# Patient Record
Sex: Male | Born: 1955 | Race: Black or African American | Hispanic: No | Marital: Married | Smoking: Heavy tobacco smoker
Health system: Southern US, Community
[De-identification: ages and names within clinical notes are randomized; demographics above are authoritative.]

---

## 2006-11-15 ENCOUNTER — Ambulatory Visit: Payer: Self-pay | Admitting: Internal Medicine

## 2006-11-15 ENCOUNTER — Inpatient Hospital Stay (HOSPITAL_COMMUNITY): Admission: EM | Admit: 2006-11-15 | Discharge: 2006-11-16 | Payer: Self-pay | Admitting: Internal Medicine

## 2006-12-01 ENCOUNTER — Ambulatory Visit: Payer: Self-pay | Admitting: Cardiology

## 2006-12-26 ENCOUNTER — Ambulatory Visit: Payer: Self-pay | Admitting: Cardiology

## 2006-12-29 ENCOUNTER — Ambulatory Visit: Payer: Self-pay | Admitting: Cardiology

## 2007-03-30 ENCOUNTER — Ambulatory Visit: Payer: Self-pay | Admitting: Cardiology

## 2007-08-03 ENCOUNTER — Ambulatory Visit: Payer: Self-pay | Admitting: Cardiology

## 2008-01-22 ENCOUNTER — Ambulatory Visit: Payer: Self-pay | Admitting: Cardiology

## 2011-05-04 NOTE — Assessment & Plan Note (Signed)
[facility]                          [facility] NOTE   Evan Beasley, Evan Beasley                       MRN:          [id number]  DATE:01/22/2008                            DOB:          08/31/1956    Evan Beasley returns today for follow-up.  He had an MI in November of  2007.  He was not a candidate at that time for PCI.  He had inferior  hypokinesis and his ejection fraction was 50%.  He had been in the Prove-  It trial for a while, but it was not possible for him to take his  medicines regularly and therefore he came out of the trial.  Fortunately, he does have a primary physician now in [place].  He  is not having any significant chest pain.  He says he has some balance  problems and said a  shivering sensation in his right leg.  There has  been no syncope or pre-syncope.   PAST MEDICAL HISTORY:   ALLERGIES:  NO KNOWN DRUG ALLERGIES.   MEDICATIONS:  Aspirin, metoprolol, simvastatin, recent dosing of  dexamethasone and gabapentin.   OTHER MEDICAL PROBLEMS:  See the list below.   REVIEW OF SYSTEMS:  Other than the HPI, his review of systems is  negative.   PHYSICAL EXAM:  Blood pressure today is 125/74 with a pulse of 96.  The  patient is oriented to person, time and place.  Affect is normal.  Weight is 131 pounds.  Lungs are clear.  Respiratory effort is not labored.  Cardiac exam reveals S1-S2.  There are no clicks or significant murmurs.  There are no carotid bruits.  There is no jugular venous distention.  His abdomen is soft.  He has normal bowel sounds.  There is no peripheral edema.   No labs were done today.   PROBLEMS INCLUDE:  1. Coronary disease status post myocardial infarction in November of      2007.  2. Ejection fraction 50%.  3. History of anemia in the past.  4. Hyperlipidemia.  5. History of tobacco.  6. Some problems that are ongoing with his right foot.   Dr. Thedore Mins is working him up.  I believe his  overall cardiac status is  stable.  No further testing.     Luis Abed, MD, [facility]  Electronically Signed    JDK/MedQ  DD: 01/22/2008  DT: 01/22/2008  Job #: [id number]   cc:   Wyonia Hough, MD

## 2011-05-04 NOTE — Assessment & Plan Note (Signed)
[facility]                          [facility]   LINDELL, RENFREW                       MRN:          [id number]  DATE:08/03/2007                            DOB:          [birth year]    Mr. Evan Beasley has coronary disease and he is stable.  He had an MI in  November 2007.  It was felt that he was not a candidate for PCI.  He had  inferior hypokinesis and an ejection fraction of 50%.  He was involved  in the Prove-It trial, but he was not able to take his medications  regularly and this was stopped.  He is not having any significant chest  pain.  He is going about reasonable activities.  He does have problems  with his right foot and right knee at times.  He has good pulse and this  is not cardiovascular in origin.  I have encouraged him to find a  primary MD.   PAST MEDICAL HISTORY:   ALLERGIES:  No known drug allergies.   MEDICATIONS:  Aspirin, metoprolol and simvastatin.   OTHER MEDICAL PROBLEMS:  See the list below.   REVIEW OF SYSTEMS:  Other than his foot, his review of systems is  negative.   PHYSICAL EXAMINATION:  Blood pressure is 123/83 with a pulse of 79.  The patient is oriented to person, time and place.  Affect is normal.  HEENT:  No xanthelasma.  He has normal extraocular motion.  There are no carotid bruits.  There is no jugulovenous distention.  CARDIAC:  Exam reveals an S1 with an S2.  There are no clicks or  significant murmurs.  ABDOMEN:  Soft.  He has normal bowel sounds.  He has 2+ distal pulses.   PROBLEMS:  1. Coronary disease, post myocardial infarction.  2. Ejection fraction of 50%.  3. History of an anemia that was mild.  4. Hyperlipidemia.  5. History of tobacco.  6. Problems with his right foot.   His cardiac status is stable.  No further cardiac workup.  I can see him  back in 6 months in cardiology followup.  He needs a primary MD.     Luis Abed, MD, [facility]  Electronically Signed   JDK/MedQ  DD: 08/03/2007  DT: 08/04/2007  Job #: [id number]

## 2011-05-07 NOTE — Discharge Summary (Signed)
NAME:  Evan Beasley, STRUBEL              ACCOUNT NO.:  [id number]   MEDICAL RECORD NO.:  [id number]          PATIENT TYPE:  INP   LOCATION:  6533                         FACILITY:  [facility]   PHYSICIAN:  Arturo Morton. Riley Kill, MD, FACCDATE OF BIRTH:  02-02-1956   DATE OF ADMISSION:  11/15/2006  DATE OF DISCHARGE:  11/16/2006                               DISCHARGE SUMMARY   PROCEDURES:  1. Cardiac catheterization.  2. Coronary arteriogram.  3. Left ventriculogram.  4. Attempted percutaneous intervention of one vessel.   PRIMARY DIAGNOSIS:  Inferior non-ST segment elevation myocardial  infarction.   SECONDARY DIAGNOSIS:  1. Dyslipidemia with a total cholesterol 143, triglycerides 42, HDL      42, LDL 93.  2. Anemia with a hemoglobin of 11.7 and hematocrit of 35.1 at      [facility] and hemoglobin 10.7 and hematocrit 32.2 (post procedure)      at discharge, fecal occult blood negative x1.  3. 30-pack-year history of tobacco use, quit on admission.   TIME OF DISCHARGE:  36 minutes.   HOSPITAL COURSE:  Mr. [name] is a 55 year old male with no previous  history of coronary artery disease.  He had chest pain that started five  days prior to admission.  His CK MBs were negative but his troponins  were elevated.  He was admitted for further evaluation and treatment.   Mr. [name] had a cardiac catheterization on November 15, 2006, which  showed a normal LAD, left main and circumflex, but the RCA was totaled  proximally with probable thrombus and left-to-right collaterals.  His EF  was 50% with mild inferobasal hypokinesis and 2+ MR.  Dr. Excell Seltzer  attempted percutaneous intervention to the RCA but was unable to advance  the guide-wire beyond the occlusion, therefore, medical therapy was felt  the best option.   Mr. [name] was seen by research and placed in the Improvement Study for  his lipid profile.  A chest x-ray showed some possible nodularity but a  2D chest x-ray without telemetry leads  showed COPD but no acute disease.   Mr. [name] was noted to be anemic with a hemoglobin of 11.7 and  hematocrit of 35.1 at [facility].  Post procedure, it dropped slightly to  10.7 and 32.2.  Fecal occult blood was negative x1.  Mr. [name] is  advised to obtain a family physician and follow up as quickly as  possible.  Because of this, he was not placed on Plavix but is to take  aspirin daily.   On November 16, 2006, Mr. [name] was seen by cardiac rehab.  He was  ambulating without chest pain or shortness of breath and educated  regarding MI restrictions, exercise guidelines, and lifestyle  modifications.  He was evaluated by Dr. Riley Kill and considered stable  for discharge with outpatient follow-up arranged.   DISCHARGE INSTRUCTIONS:  His activity level is to be increased gradually  per cardiac rehab guidelines.  He is not to drive for two days and not  to lift anything heavy for three weeks.  He is to call our office for  any problems with the  cath site.  He is to stick to a low fat diet.  He  is not to use tobacco.  He is to obtain a family physician and follow up  his anemia.  He has an appointment with the [facility] office on  December 13 at 1:30 p.m.   DISCHARGE MEDICATIONS:  1. Aspirin 325 mg daily.  2. Improvement medicine daily.  3. Nitroglycerin sublingual p.r.n..   Consideration was given to placing Mr. [name] on a beta blocker or ACE  inhibitor, but his systolic blood pressure was 90-100 on no medications  and so this is deferred for now.      Theodore Demark, PA-C      Arturo Morton. Riley Kill, MD, [facility]  Electronically Signed    RB/MEDQ  D:  11/16/2006  T:  11/16/2006  Job:  [id number]

## 2011-05-07 NOTE — Assessment & Plan Note (Signed)
[facility] HEALTHCARE                            CARDIOLOGY OFFICE NOTE   AURYN, PAIGE                       MRN:          [id number]  DATE:03/30/2007                            DOB:          06-28-1956    Evan Beasley is seen for follow-up of his coronary disease.  He had an MI.  This area was not amenable to PCI.  He has an ejection fraction of 50%  with inferiorly hypokinesis.  He had been involved in the IMPROVE-IT  trial, however, he was not able to take his medications well.  He did  have some back discomfort.  He stopped his medications.  The follow-up  was difficult and we decided to stop the study drug.  He is here for  follow-up now.   He is not having any chest pain.  He is going about full activities.   ALLERGIES:  NO KNOWN DRUG ALLERGIES.   MEDICATIONS:  Aspirin and metoprolol and we will add Simvastatin.   OTHER MEDICAL PROBLEMS:  See the list below.   REVIEW OF SYSTEMS:  He is feeling well.  He has some slight difficulty  raising the toe on his foot.  The exact etiology is not clear.  There is  no pain.  He has no problems ambulating.  Otherwise, review of systems  is negative.   PHYSICAL EXAMINATION:  GENERAL APPEARANCE:  Patient is oriented to  person, time and place.  Affect is normal.  VITAL SIGNS:  Blood pressure 118/76 with a pulse of 78, weight 127  pounds.  LUNGS:  Clear.  Respiratory effort is not labored.  CARDIOVASCULAR:  Exam reveals an S1 with an S2.  There are no clicks or  significant murmurs.  ABDOMEN:  Soft.  He has no significant peripheral edema.   Evan Beasley is stable.  We will take him off of the IMPROVE-IT trial.  We  will start Simvastatin 40 mg daily and see him for follow-up.   PROBLEMS:  1. Coronary disease, post myocardial infarction.  2. Anemia that was mild.  3. Hyperlipidemia.  4. History of tobacco.   We will see him for follow-up in the [place], [place], office.     Luis Abed, MD,  [facility]  Electronically Signed    JDK/MedQ  DD: 03/30/2007  DT: 03/30/2007  Job #: [id number]   cc:   [facility] office

## 2011-05-07 NOTE — H&P (Signed)
NAME:  Evan Beasley, Evan Beasley NO.:  [id number]   MEDICAL RECORD NO.:  [id number]          PATIENT TYPE:  INP   LOCATION:  3707                         FACILITY:  [facility]   PHYSICIAN:  Pricilla Riffle, MD, FACCDATE OF BIRTH:  19-Feb-1956   DATE OF ADMISSION:  11/15/2006  DATE OF DISCHARGE:                              HISTORY & PHYSICAL   He has no primary care physician.   The patient will be FULL CODE.   This patient was the historian; he was a good historian.   Total visit time 49 minutes.   CHIEF COMPLAINT:  Transfer from [facility] ER for chest pain and abnormal  laboratory values.   HISTORY OF PRESENT ILLNESS:  Evan Beasley is a 55 year old male with no  history of coronary artery disease, history of longstanding tobacco  abuse who presented to the emergency room at [facility] with chest pain.  The patient notes chest pain four days prior to being seen in the ER at  [facility], five days prior to admission here, in the morning of that day  while smoking a cigarette at rest, described as left-sided precordial  pressure sensation with no radiation lasting 30 minutes, severe,  associated with sweats and flushing, but pain spontaneously relieved.  The patient notes he has had no problems recently with shortness of  breath, paroxysmal nocturnal dyspnea or any dyspnea on exertion.  He  denies having problems with syncope, palpitations.  He denies any  claudication symptoms or lower extremity edema.  The patient notes he  had no problems with chest pain or chest pressure until the day prior to  admission.  While at work, he had some mild chest pressure and told one  of his coworkers who suggested that he be seen in the ER.  The patient's  wife then drove him to the emergency room.  In the emergency room,  patient was found to have a normal EKG, given aspirin 81 versus 324 mg,  given metoprolol 5 IV, normal saline, NitroPaste and nitro sublingual  and also enoxaparin at 2313,  60 mg.   PAST MEDICAL HISTORY AND PAST SURGICAL HISTORY:  He has no past surgical  history.  History of tobacco use.   ALLERGIES:  No known drug allergies.   MEDICATIONS:  None.   SOCIAL HISTORY:  Smoker, one pack per day for the last 30 years; he  notes he will quit and has not smoked since the episode of chest pain.  No illicit drug use, rare alcohol.  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  He is married with one daughter.   REVIEW OF SYSTEMS:  CONSTITUTIONAL:  The patient denies having any  fever.  He notes flushing and sweats as above.  EYES:  Concerning the  eyes, he denies any drainage from the eye or eye pain.  EARS/NOSE/MOUTH/THROAT:  No throat swelling or throat pain.  CARDIOVASCULAR:  As above.  RESPIRATORY:  He denies any shortness of  breath or wheezing or cough.  GASTROINTESTINAL:  He denies any nausea or  diarrhea.  GENITOURINARY:  He denies any dysuria or  hematuria.  MUSCULOSKELETAL:  He denies any myalgias or arthralgias.  INTEGUMENTARY:  He denies any skin changes or rash.  NEUROLOGICAL:  He denies any focal  weakness or seizure history.  PSYCHIATRIC:  He denies any depression or  suicidal ideations.  ENDOCRINE:  No polyuria or polydipsia.  HEMATOLOGIC/LYMPHATIC:  He denies any knots or any easy bruising.  ALLERGIC/IMMUNOLOGIC:  He denies any allergies to aspirin, no allergic  rhinitis history.   PHYSICAL EXAMINATION:  VITAL SIGNS:  Temperature 98, blood pressure  106/68 with a pulse of 14, respiratory rate of 69, saturation 100% on  two liters.  He is 65.7 kg.  GENERAL:  He is a very thin male, otherwise healthy, no acute distress  lying flat in bed.  EYES:  Inspection of the conjunctiva and lids reveals his conjunctivae  are not pale and no masses over the lids.  Anicteric sclerae.  Pupils  equal, round and reactive to light.  Extraocular movements are intact.  EAR/NOSE/MOUTH/THROAT EXAM:  External  inspection of the ears and nose  reveals no lesions  or masses.  Testing to hearing by finger rub is full.  Inspection of the nasal mucosa, septum and turbinates show no bogginess  to the mucosa, no lesions.  Inspection of the lips, teeth and gums,  salivary glands, hard and soft palate, tongue, tonsils and posterior  pharynx revealed good dentition with no oropharyngeal lesions or masses.  NECK EXAMINATION:  His trachea is midline with no crepitus, no  thyromegaly.  RESPIRATORY EXAM:  No use of accessory muscles, no intercostal  retractions.  Lungs are clear to auscultation bilaterally; no dullness  to percussion.  CARDIOVASCULAR EXAM:  Regular rhythm and rate with no murmurs, rubs or  gallops, normal S1, S2, no S3 or S4, no elevated jugular venous  distention.  No abdominal bruits.  Pulses are 2+ bilateral radial and  also dorsalis pedis and posterior tibial.  GASTROINTESTINAL EXAM:  The abdomen is scaphoid, nontender,  nondistended, no hepatosplenomegaly, normoactive bowel sounds.  LYMPHATIC EXAM:  The neck and axillae reveal no lymphadenopathy.  MUSCULOSKELETAL EXAM:  Including palpation and inspection of the digits  and nails reveals no clubbing, cyanosis or evidence of petechiae.  Examination by palpation of the joints, bones and muscles over the head,  neck, spine, ribs and pelvis, right and left upper extremity, right and  left lower extremity reveals no signs of misalignment, masses or  effusions.  SKIN EXAMINATION:  He has no rashes, lesions or ulcers.  NEUROLOGIC EXAMINATION:  His cranial nerves II-XII are grossly intact.  Strength and sensation are grossly intact.  PSYCHIATRIC EXAM:  Reveals normal affect, alert and oriented x4.   DIAGNOSTIC DATA:  EKG done in the ER outside hospital at 9:44 November  26 showed sinus rhythm at 65 with normal axis, normal intervals with T  wave inversions and ST downsloping in II, III and aVF.   EKG done November 15, 2006 at 3:44 a.m. here was normal sinus rhythm at 62 with normal axis,  normal intervals with no acute ST-T changes.  He  did have some mild T-wave inversions in II and aVF.   Outside hospital labs showed a sodium of 37, potassium of 3.5, chloride  of 102, bicarb 30, BUN 13, creatinine 1.4, glucose 108; unsure of his  baseline.  Calcium is 9.2, CK 279, MB 3.2, troponin 4.83, INR 1.0, PT  11.6, PTT 27.2, D-dimer was normal at 342.  BNP was mildly elevated at  113.  White count  was 11.8, hemoglobin 11.7, hematocrit 35.1, platelets  190, segs 92.5.   ASSESSMENT/PLAN:  This is a patient with a history of longstanding  tobacco abuse now with possible, most likely a non-ST elevation MI,  after noting the EKG changes and elevated troponin.  Since given his  therapy in the emergency room while here, his EKG changes have near-  resolved and he has had no chest pain since, although his primary  episode may have occurred four to five days ago.  He did have a 10-beat  run of V-tach, what looked like a wide-complex rhythm, most likely V-  tach on telemetry which is likely due to ischemia.   1. For his non-ST elevation MI, the patient will be held n.p.o. for      possible procedure.  Ejection fraction can be obtained by left      ventriculogram or by echocardiogram; will hold on the order for      now.  He will be given aspirin 325, Lovenox q.12., Integrilin drip.      Statin will be started and we will check his lipids; Plavix 375.      He will be started on a p.o. beta-blocker and we will check his      TSH.  He will remain on telemetry and we will check his cardiac      markers x3.  2. For his V-tach, again he will be on a beta-blocker and we will try      to relieve his ischemia by antiplatelet and anticoagulation      therapy.  3. For his GI prophylaxis, proton pump inhibitor and for DVT      prophylaxis, he is on Lovenox.      Darryl D. Prime, MD   Electronically Signed     ______________________________  Pricilla Riffle, MD, [facility]    DDP/MEDQ  D:   11/15/2006  T:  11/15/2006  Job:  [id number]

## 2011-05-07 NOTE — Cardiovascular Report (Signed)
NAME:  Evan Beasley, Evan Beasley              ACCOUNT NO.:  [id number]   MEDICAL RECORD NO.:  [id number]          PATIENT TYPE:  INP   LOCATION:  6533                         FACILITY:  [facility]   PHYSICIAN:  Veverly Fells. Excell Seltzer, MD  DATE OF BIRTH:  1956-06-20   DATE OF PROCEDURE:  11/15/2006  DATE OF DISCHARGE:                            CARDIAC CATHETERIZATION   PROCEDURE:  Left heart catheterization, selective coronary angiography,  left ventricular angiography, attempted percutaneous coronary  intervention of the right coronary artery and Angio-Seal of the right  femoral artery.   INDICATIONS:  Mr. Evan Beasley is a very nice 55 year old gentleman who  presented with chest pain that occurred five days prior to his  presentation.  He had recurrent pain that brought him to the hospital  and he ultimately ruled in for a myocardial infarction with a troponin  that peaked at approximately 5.0.  He was referred for diagnostic  cardiac catheterization in the setting of a subendocardial MI.   PROCEDURAL DETAILS:  Risks and indications of the procedure were  explained in detail to the patient.  Informed consent was obtained.  The  right groin was prepped, draped and anesthetized with 1% lidocaine under  normal sterile conditions.  Using a modified Seldinger technique, a 6-  [place] arterial sheath was placed in the right femoral artery.  Initial  angiographic images of the left coronary artery were taken with a  diagnostic JL-4 catheter.  Following the left coronary artery  angiograms, an angled pigtail catheter was inserted in the left  ventricle and ventricular pressures were recorded.  A 30 degree right  anterior oblique left ventriculogram was performed.  A pullback across  the aortic valve was done.  After left ventriculography, a 6-French JR-4  coronary guiding catheter was inserted into the right coronary artery  and angiographic images demonstrated a total occlusion of that vessel.  At that point,  because of the appearance of a significant thrombus  burden, heparin and Integrilin were given for anticoagulation.  Once a  therapeutic ACT was achieved, I attempted to cross the lesion initially  with [place]ougar coronary guidewire.  I was unable to cross with this wire.  I then changed out for a 300 cm whisper wire and 2.0 x 12 mm sprinter  balloon.  Even with the whisper wire, I was unable to cross the lesion  due to marked tortuosity in the proximal vessel as well as a long  segment of occlusion in the mid-vessel.  Leaving the whisper wire in  place, I inserted a PT light weight wire and was also unable to cross  with this.  I took multiple angiograms from different views and really  was unable to ever visualize the course of the vessel beyond the  occlusion.  At this point, I thought the benefit of proceeding further  was minimal and the risk was significant.  I elected to discontinue the  intervention and treat the patient medically.  The guidewire and guiding  catheter were removed and a 6-French Angio-Seal was used to close the  femoral arteriotomy.   FINDINGS:  Aortic pressure 83/49 with  a mean of 65.  Left ventricular  pressure 86/3 with an end-diastolic pressure of 10.   CORONARY ANGIOGRAPHY:  Left mainstem is angiographically normal.  It  trifurcates into the LAD, ramus intermedius and left circumflex.   The LAD is a large-caliber vessel that courses down to the left  ventricular apex.  There are three medium caliber diagonal branches.  There are multiple septal perforators.  There is no significant  angiographic disease in the LAD distribution.   Ramus intermedius is a large-caliber vessel that bifurcates into twin  vessels.  It is angiographically normal.   Left circumflex is large caliber vessel that courses down the AV groove  and gives off two posterolateral branches.  There is only one small  obtuse marginal branch.  There is no significant disease throughout the  left  circumflex.  Left circumflex system provides a well-formed  collateral to the distal right coronary artery and the entire right PDA  fills from left-sided collaterals.  Right PDA is a large vessel.   The right coronary artery is very tortuous in its proximal portion.  It  is a large diameter vessel.  It is 100% occluded just after the first RV  marginal branch.  There is the appearance of thrombus at the level of  occlusion.  There is some faint distal filling via right to right  collaterals.   Left ventriculogram performed in a 30 degree right anterior oblique  projection demonstrated mild inferior basal hypokinesis with an overall  preserved left ventricular ejection fraction of 50%.  There is 2+ mitral  regurgitation present.   ASSESSMENT:  1. Single-vessel coronary artery disease with an occluded right      coronary artery.  2. Mild segmental left ventricular dysfunction with overall preserved      left ventricular ejection fraction.   PLAN:  As described above, attempted PCI of the right coronary artery  but unable to cross the lesion with a guidewire despite attempting  multiple wires as well as attempting support with an over the wire  balloon.  Since the patient has been painfree and has a well-formed  collateral to the distal right coronary artery, I think the risk of  proceeding more aggressively in the right coronary artery with stiff  guidewires outweighs the potential benefits.  In addition, the proximal  vessel was extremely tortuous and not amendable to a stiff wire.  Recommend medical therapy for continued treatment of his coronary artery  disease.      Veverly Fells. Excell Seltzer, MD  Electronically Signed     MDC/MEDQ  D:  11/15/2006  T:  11/16/2006  Job:  [id number]

## 2011-05-07 NOTE — Assessment & Plan Note (Signed)
[facility]                          [facility] NOTE   CASSIDY, TABET                       MRN:          [id number]  DATE:12/01/2006                            DOB:          Oct 03, 1956    Mr. Zwilling is new to [place]. He does not have a primary physician. He was  seen at the emergency room at [facility] and transported to [facility]. He  was having a non ST elevation MI. He underwent catheterization. This was  done on November 15, 2006 by Dr. Excell Seltzer. It is noted that his troponin  peaked in the range of 5. 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 His ejection  fraction was in the 50% range. PCI was attempted to the right coronary  artery but it was not possible to cross the lesion. The guidewire could  not be crossed. The patient was pain free by then and he had well formed  collateral to his distal right. It was felt that the risk of proceeding  outweighed the potential benefit. The proximal vessel was very tortuous  and this made the procedure even more difficult. Medical therapy is  recommended.   The patient returns now. He is having some fatigue. He had some vague  chest discomfort but nothing like the pain that brought him into the  hospital.   PAST MEDICAL HISTORY:   ALLERGIES:  No known drug allergies.   MEDICATIONS:  1. Aspirin 325.  2. IMPROVE IT research medication.   OTHER MEDICAL PROBLEMS:  See the list below.   REVIEW OF SYSTEMS:  Other than some mild fatigue, he is feeling  relatively well.   PHYSICAL EXAMINATION:  VITAL SIGNS:  Today his blood pressure is 98/64  with a pulse of 75. He weighs 127 pounds.  GENERAL:  The patient is oriented to person, time and place. His affect  is normal.  HEENT:  Reveals no xanthelasma. He has normal extraocular motion. There  are no carotid bruits. There is no jugular venous distention.  CARDIAC:  Reveals an S1 with an  S2. There are no clicks or significant  murmurs.  LUNGS:  Clear. Respiratory effort is not labored.  ABDOMEN:  Soft. He has no masses or bruits. He has no significant  peripheral edema.   EKG reveals Q waves in the inferior leads with inverted T waves. He has  sinus rhythm.   PROBLEMS:  1. Fatigue.  2. Headaches.  3. Mild shortness of breath.  4. Mild cramping in his right leg.  5. History of tobacco use and he quit on November 15, 2006.  6. Dyslipidemia on study drug.  7. Anemia while in the hospital and his hemoglobin will have to be      followed up.  8. Non ST elevation MI affecting the right coronary artery. As      described above, he had a totally occluded vessel that could not be      opened and he had good collaterals. Medical therapy will be most  appropriate. The patient is stable. We will allow him to increase      his activity over the next several weeks and then we will see him      back in an attempt to get him back to work in early January.     Luis Abed, MD, [facility]  Electronically Signed    JDK/MedQ  DD: 12/01/2006  DT: 12/01/2006  Job #: [id number]

## 2011-05-07 NOTE — Assessment & Plan Note (Signed)
[facility]                          [facility]   Evan Beasley, Evan Beasley                       MRN:          [id number]  DATE:12/26/2006                            DOB:          [birth year]    PRIMARY CARDIOLOGIST:  Dr. Willa Rough   REASON FOR THIS VISIT:  Scheduled for followup.   Since last seen here in the clinic by Dr. Willa Rough on December 13,  for initial post hospital follow up, patient continues to experience  some mild upper chest tightness associated with brisk walking.  He  denies any symptoms at rest.  It is somewhat reminiscent of his MI  presentation, but much less intense.   The patient has stopped smoking since his heart attack.  He is otherwise  doing well and is anxious to return to work on January 14.  He reports  compliance with his medication regimen.   CURRENT MEDICATIONS:  1. Aspirin 325 daily.  2. IMPROVE IT research study drug as directed.   PHYSICAL EXAMINATION:  Blood pressure 104/66, pulse 72, regular weight  127.  GENERAL:  A 55 year old male sitting upright in no distress.  HEENT:  Normocephalic.  Atraumatic.  NECK:  Palpable bilateral carotid pulses without bruits; no JVD.  LUNGS:  Clear to auscultation in all fields.  HEART:  Regular rate and rhythm (S1, S2).  No murmurs, rubs or gallops.  ABDOMEN:  Soft, nontender, intact bowel sounds.  EXTREMITIES:  Intact pulses without edema.  NEURO:  No focal deficit.   REVIEW OF SYSTEMS:  The patient does report a history of occasional  bright red blood per rectum, but no recent overt bleeding nor any  melena.   IMPRESSION:  1. A single vessel coronary artery disease.      a.     Status post non ST elevation myocardial infarction secondary       to occluded right coronary artery, not amendable to percutaneous       intervention.      b.     Mild left ventricular dysfunction (EF of 50%) with inferior       basal hypokinesis.  2. Hypotension.  3.  Normocytic anemia.  4. Hyperlipidemia.      a.     Enrolled in IMPROVE IT study.  5. A history of tobacco.   PLAN:  1. Initiate low dose beta blocker therapy with Toprol XL 12.5 daily      for management of a recurrent exertional chest tightness.  We will      have patient return to the clinic later this week for an EKG and      blood pressure check for continued close monitoring.  2. Check a follow up CBC and iron profile.  The patient has not yet      established with a primary care physician and we will initiate      initial workup for his documented anemia.  3. The patient has been cleared to return to work on January 14.  4. Schedule return clinic followup with myself and Dr. Willa Rough in  2 months.      Gene Serpe, PA-C  Electronically Signed      Learta Codding, MD,FACC  Electronically Signed   GS/MedQ  DD: 12/26/2006  DT: 12/26/2006  Job #: [id number]

## 2013-03-27 ENCOUNTER — Other Ambulatory Visit: Payer: Self-pay | Admitting: Neurology

## 2013-03-27 DIAGNOSIS — R269 Unspecified abnormalities of gait and mobility: Secondary | ICD-10-CM

## 2013-03-29 ENCOUNTER — Ambulatory Visit (HOSPITAL_COMMUNITY): Payer: Medicare Other

## 2013-04-03 ENCOUNTER — Ambulatory Visit (HOSPITAL_COMMUNITY)
Admission: RE | Admit: 2013-04-03 | Discharge: 2013-04-03 | Disposition: A | Discharge status: Home / Self Care | Payer: Medicare Other | Source: Ambulatory Visit | Attending: Neurology | Admitting: Neurology

## 2013-04-03 DIAGNOSIS — R269 Unspecified abnormalities of gait and mobility: Secondary | ICD-10-CM

## 2013-04-03 DIAGNOSIS — G319 Degenerative disease of nervous system, unspecified: Secondary | ICD-10-CM | POA: Insufficient documentation

## 2014-01-02 ENCOUNTER — Ambulatory Visit (HOSPITAL_COMMUNITY)
Admission: RE | Admit: 2014-01-02 | Discharge: 2014-01-02 | Disposition: A | Discharge status: Home / Self Care | Payer: Medicare Other | Source: Ambulatory Visit | Attending: Nurse Practitioner | Admitting: Nurse Practitioner

## 2014-01-02 ENCOUNTER — Other Ambulatory Visit (HOSPITAL_COMMUNITY): Payer: Self-pay | Admitting: Nurse Practitioner

## 2014-01-02 DIAGNOSIS — M25569 Pain in unspecified knee: Secondary | ICD-10-CM

## 2014-10-18 ENCOUNTER — Ambulatory Visit (HOSPITAL_COMMUNITY)
Admission: RE | Admit: 2014-10-18 | Discharge: 2014-10-18 | Disposition: A | Discharge status: Home / Self Care | Payer: Medicare Other | Source: Ambulatory Visit | Attending: Neurology | Admitting: Neurology

## 2014-10-18 DIAGNOSIS — R296 Repeated falls: Secondary | ICD-10-CM | POA: Diagnosis not present

## 2014-10-18 DIAGNOSIS — Z5189 Encounter for other specified aftercare: Secondary | ICD-10-CM | POA: Diagnosis not present

## 2014-10-18 DIAGNOSIS — M25673 Stiffness of unspecified ankle, not elsewhere classified: Secondary | ICD-10-CM | POA: Insufficient documentation

## 2014-10-18 DIAGNOSIS — M6281 Muscle weakness (generalized): Secondary | ICD-10-CM | POA: Diagnosis not present

## 2014-10-18 DIAGNOSIS — R262 Difficulty in walking, not elsewhere classified: Secondary | ICD-10-CM | POA: Diagnosis not present

## 2014-10-18 DIAGNOSIS — M25671 Stiffness of right ankle, not elsewhere classified: Secondary | ICD-10-CM

## 2014-10-18 DIAGNOSIS — R29898 Other symptoms and signs involving the musculoskeletal system: Secondary | ICD-10-CM | POA: Insufficient documentation

## 2014-10-18 DIAGNOSIS — R2689 Other abnormalities of gait and mobility: Secondary | ICD-10-CM | POA: Insufficient documentation

## 2014-10-18 NOTE — Evaluation (Signed)
Physical Therapy Evaluation  Patient Details  Name: Evan Beasley MRN: [id number] Date of Birth: 11/14/1956  Today's Date: 10/18/2014 Time: 1520-1559 PT Time Calculation (min): 39 min Charge:  Evaluation              Visit#: 1 of 8  Re-eval: 11/17/14 Assessment Diagnosis: difficulty walking Surgical Date: 12/08/14  Authorization: medicare      Authorization Visit#: 1 of 8   Past Medical History: No past medical history on file. Past Surgical History: No past surgical history on file.  Subjective Symptoms/Limitations Symptoms: Evan Beasley states that he had back surgery in 2009 but did not have therapy.  He continued to get worse and ended up using a wheelchair most the time.   He then had a second back surgery in 2011  he had HH therapy got him walking a little bit but he noticed that he is worsening therefore he is bieng referred to PT .  Pertinent History: cervical surgery between back surgeries.   How long can you sit comfortably?: no problem  How long can you stand comfortably?: less than two minutes How long can you walk comfortably?: walk with a walker for 10 to 15 minutes but very slow.  Patient Stated Goals: Pt want to be able to be more active.  He would like to get to where he can walk with his walker.   Precautions/Restrictions   falls   Balance Screening Balance Screen Has the patient fallen in the past 6 months: Yes How many times?: 5 Has the patient had a decrease in activity level because of a fear of falling? : Yes Is the patient reluctant to leave their home because of a fear of falling? : Yes  Prior Functioning  Prior Function Vocation: On disability Leisure: Hobbies-yes (Comment) Comments: hunt/fish     Sensation/Coordination/Flexibility/Functional Tests Functional Tests Functional Tests: foto 25 Functional Tests: sit to stand without hands -unable   Assessment RLE AROM (degrees) Right Ankle Dorsiflexion: -5 RLE Strength Right Hip Flexion:  4/5 Right Hip Extension: 3/5 Right Hip ABduction: 3-/5 Right Knee Flexion: 5/5 Right Knee Extension: 3+/5 Right Ankle Dorsiflexion: 4/5 Right Ankle Inversion: 2/5 Right Ankle Eversion: 2+/5 LLE AROM (degrees) Left Ankle Dorsiflexion: 0 LLE Strength Left Hip Flexion: 3+/5 Left Hip Extension: 3/5 Left Hip ABduction: 3-/5 Left Knee Flexion: 5/5 Left Knee Extension:  (4+/5) Left Ankle Dorsiflexion:  (4+/5) Left Ankle Inversion: 2/5 Left Ankle Eversion: 2/5  Exercise/Treatments    Seated Long Arc Quad: 5 reps;Both Other Seated Knee Exercises: heel raise/ toe raise x 5 Supine Quad Sets: 5 reps;Both Bridges: 5 reps Straight Leg Raises: 5 reps;Both Sidelying Hip ABduction: 5 reps Prone  Hip Extension: 5 reps     Physical Therapy Assessment and Plan PT Assessment and Plan Clinical Impression Statement: Pt is a 58 yo male who has undergone two lumbar and one cervical surgery resulting in significant weakness.  The patient states that in the past several months his weakness has increased and he has been falling therefore he sought medical attention who has referred him to physical therapy.  Evan Beasley will benefit from skilled therapy to address his deficits of decreased core and LE strenght, decreased balance and jt stiffness  to maximize his functional ability.  Pt will benefit from skilled therapeutic intervention in order to improve on the following deficits: Abnormal gait;Decreased activity tolerance;Decreased balance;Decreased range of motion;Decreased strength;Difficulty walking;Pain Rehab Potential: Good PT Frequency: Min 2X/week PT Duration: 8 weeks PT Treatment/Interventions: Gait training;Therapeutic activities;Therapeutic exercise;Balance  training PT Plan: begin Weight bearing strengthening, balance and gt with SPC to improve function in pt.     Goals Home Exercise Program Pt/caregiver will Perform Home Exercise Program: For increased strengthening PT Goal: Perform  Home Exercise Program - Progress: Goal set today PT Short Term Goals Time to Complete Short Term Goals: 2 weeks PT Short Term Goal 1: Pt to be able to come sit to stand without use of UE to demonstrate increased power. PT Short Term Goal 2: Pt core strength to be incresed to allow SLS for 5 seconds to reduce risk of falling  PT Short Term Goal 3: ankle DF to be increased by 5 degrees B to allow more normalized gt  PT Long Term Goals Time to Complete Long Term Goals: 4 weeks PT Long Term Goal 1: I in advance HEP PT Long Term Goal 2: Pt to be able to complete 3 sit to stand in30 seconds to demonstrate improved power  Long Term Goal 3: Pt to be able to stand for five minutes to be able to complete self grooming tasks.  Long Term Goal 4: Pt core strength to be strong enough to allow SLS x 10 seconds to reduce risk of fall  PT Long Term Goal 5: Pt to be ambulating with a cane.   Problem List Patient Active Problem List   Diagnosis Date Noted  . Weakness of both legs 10/18/2014  . Stiffness of ankle joint 10/18/2014  . Poor balance 10/18/2014    PT Plan of Care PT Home Exercise Plan: given for mat exercises   GP Functional Assessment Tool Used: foto  Functional Limitation: Mobility: Walking and moving around Mobility: Walking and Moving Around Current Status ([id number](G8978): At least 60 percent but less than 80 percent impaired, limited or restricted Mobility: Walking and Moving Around Goal Status [phone](G8979): At least 40 percent but less than 60 percent impaired, limited or restricted  Dorsie Burich,CINDY 10/18/2014, 4:36 PM  Physician Documentation Your signature is required to indicate approval of the treatment plan as stated above.  Please sign and either send electronically or make a copy of this report for your files and return this physician signed original.   Please mark one 1.__approve of plan  2. ___approve of plan with the following conditions.   ______________________________                                                           _____________________ Physician Signature                                                                                                             Date

## 2014-10-21 ENCOUNTER — Telehealth (HOSPITAL_COMMUNITY): Payer: Self-pay | Admitting: Physical Therapy

## 2014-10-21 ENCOUNTER — Ambulatory Visit (HOSPITAL_COMMUNITY): Payer: Medicare Other | Admitting: Physical Therapy

## 2014-10-21 NOTE — Telephone Encounter (Signed)
Has another MD apptment today to get his meds.

## 2014-10-23 ENCOUNTER — Encounter (HOSPITAL_COMMUNITY): Payer: Self-pay | Admitting: Physical Therapy

## 2014-10-23 ENCOUNTER — Ambulatory Visit (HOSPITAL_COMMUNITY)
Admission: RE | Admit: 2014-10-23 | Discharge: 2014-10-23 | Disposition: A | Discharge status: Home / Self Care | Payer: Medicare Other | Source: Ambulatory Visit | Attending: Neurology | Admitting: Neurology

## 2014-10-23 DIAGNOSIS — R262 Difficulty in walking, not elsewhere classified: Secondary | ICD-10-CM | POA: Insufficient documentation

## 2014-10-23 DIAGNOSIS — R296 Repeated falls: Secondary | ICD-10-CM | POA: Insufficient documentation

## 2014-10-23 DIAGNOSIS — M6281 Muscle weakness (generalized): Secondary | ICD-10-CM | POA: Insufficient documentation

## 2014-10-23 DIAGNOSIS — M25673 Stiffness of unspecified ankle, not elsewhere classified: Secondary | ICD-10-CM | POA: Insufficient documentation

## 2014-10-23 DIAGNOSIS — M25671 Stiffness of right ankle, not elsewhere classified: Secondary | ICD-10-CM

## 2014-10-23 DIAGNOSIS — Z5189 Encounter for other specified aftercare: Secondary | ICD-10-CM | POA: Insufficient documentation

## 2014-10-23 DIAGNOSIS — R2689 Other abnormalities of gait and mobility: Secondary | ICD-10-CM

## 2014-10-23 DIAGNOSIS — R29898 Other symptoms and signs involving the musculoskeletal system: Secondary | ICD-10-CM

## 2014-10-23 NOTE — Therapy (Signed)
Physical Therapy Treatment  Patient Details  Name: Evan Beasley MRN: [id number] Date of Birth: 12/12/1956  Encounter Date: 10/23/2014      PT End of Session - 10/23/14 1344    Visit Number 2   Number of Visits 8   Date for PT Re-Evaluation 11/17/14   PT Start Time 1300   PT Stop Time 1344   PT Time Calculation (min) 44 min   PT Charge Details TE 1300-1344      No past medical history on file.  No past surgical history on file.  There were no vitals taken for this visit.  Visit Diagnosis:  Weakness of both legs  Stiffness of ankle joint, right  Poor balance          Adult PT Treatment/Exercise - 10/23/14 1319    Knee/Hip Exercises: Stretches   Active Hamstring Stretch 3 reps;30 seconds   Active Hamstring Stretch Limitations 14" Box   Gastroc Stretch 3 reps;30 seconds   Gastroc Stretch Limitations Slantboard   Knee/Hip Exercises: Standing   Heel Raises 10 reps   Heel Raises Limitations TR x10   Knee/Hip Exercises: Seated   Long Arc Quad 2 sets;5 reps;Weights   Long Arc Quad Weight 1 lbs.   Knee/Hip Exercises: Supine   Bridges 10 reps   Straight Leg Raises 2 sets;5 reps;Both   Knee/Hip Exercises: Sidelying   Hip ABduction 2 sets;5 reps;Both   Hip ADduction 2 sets;5 reps;Both   Knee/Hip Exercises: Prone   Hamstring Curl 2 sets;5 reps   Hamstring Curl Limitations 1#   Hip Extension 2 sets;5 sets;Both   Balance Exercises   Tandem Stance 20 secs;2 reps;Intermittent HHA;Eyes open            PT Short Term Goals - 10/23/14 1312    PT SHORT TERM GOAL #1   Title Pt will be able to come sit to stand without use of UE to demonstrate increased power.     Time 2   Period Weeks   Status On-going   PT SHORT TERM GOAL #2   Title Pt will demonstrate increased core strength to all SLS for 5 seconds to reduce risk of falling.    Time 2   Period Weeks   Status On-going   PT SHORT TERM GOAL #3   Title ankle DF to be increased by 5 degrees B to allow more  normalized gt    Time 2   Period Weeks   Status On-going          PT Long Term Goals - 10/23/14 1315    PT LONG TERM GOAL #1   Title Pt will be (I) in advanced HEP.    Time 4   Period Weeks   Status On-going   PT LONG TERM GOAL #2   Title Pt will be able to complete 3 sit to stand in 30 seconds to demonstrate improved power.    Time 4   Period Weeks   Status On-going   PT LONG TERM GOAL #3   Title Pt to be able to stand for five minutes to be able to complete self grooming tasks.    Time 4   Status On-going   PT LONG TERM GOAL #4   Title Pt core strength to be strong enough to allow SLS x 10 seconds to reduce risk of fall    Time 4   Status On-going   PT LONG TERM GOAL #5   Title Pt to be ambulating with a  cane.    Period Weeks   Status On-going          Plan - 10/23/14 1345    Clinical Impression Statement PT POC iniatied, reviewing HEP and increasing reps to tolerance.  Noted increased weakness on Lt > RT LE with exercises, particularly with hip extension and quads today.  Added tandem balance, with pt requiring min guard and ocassional HHA to maintain balance; noted difficulty with balance on Lt > Rt side.     Pt will benefit from skilled therapeutic intervention in order to improve on the following deficits Abnormal gait;Decreased activity tolerance;Decreased balance;Decreased range of motion;Decreased strength;Difficulty walking;Pain   Rehab Potential Good   PT Frequency Min 2X/week   PT Duration 8 weeks   PT Treatment/Interventions Gait training;Therapeutic activities;Therapeutic exercise;Balance training   PT Plan Pt to bring medication list next session.  Progress CKC exercises, with increasing reps to tolerance.  Add STS if able.          Problem List Patient Active Problem List   Diagnosis Date Noted  . Weakness of both legs 10/18/2014  . Stiffness of ankle joint 10/18/2014  . Poor balance 10/18/2014          Francelia Mclaren 10/23/2014,  1:46 PM

## 2014-10-30 ENCOUNTER — Ambulatory Visit (HOSPITAL_COMMUNITY)
Admission: RE | Admit: 2014-10-30 | Payer: Medicare Other | Source: Ambulatory Visit | Attending: Neurology | Admitting: Neurology

## 2014-11-01 ENCOUNTER — Ambulatory Visit (HOSPITAL_COMMUNITY): Payer: Medicare Other | Admitting: Physical Therapy

## 2014-11-04 ENCOUNTER — Ambulatory Visit (HOSPITAL_COMMUNITY): Payer: Medicare Other

## 2014-11-06 ENCOUNTER — Ambulatory Visit (HOSPITAL_COMMUNITY): Payer: Medicare Other | Admitting: Physical Therapy

## 2014-11-11 ENCOUNTER — Ambulatory Visit (HOSPITAL_COMMUNITY): Payer: Medicare Other

## 2014-11-13 ENCOUNTER — Ambulatory Visit (HOSPITAL_COMMUNITY): Payer: Medicare Other | Admitting: Physical Therapy

## 2014-11-18 ENCOUNTER — Ambulatory Visit (HOSPITAL_COMMUNITY): Payer: Medicare Other | Admitting: Physical Therapy

## 2014-11-18 NOTE — Addendum Note (Signed)
Encounter addended by: Bella Kennedyynthia J Russell, PT on: 11/18/2014  8:17 AM<BR>     Documentation filed: Clinical Notes, Episodes

## 2014-11-18 NOTE — Therapy (Signed)
  Patient Details  Name: Evan Beasley MRN: [id number] Date of Birth: 1956/07/14  Encounter Date: 10/18/2014  PHYSICAL THERAPY DISCHARGE SUMMARY  Visits from Start of Care: 1 Current functional level related to goals / functional outcomes: same   Remaining deficits: same   Education / Equipment: N/A  Plan: Patient agrees to discharge.  Patient goals were not met. Patient is being discharged due to not returning since the last visit.  ?????       RUSSELL,CINDY 11/18/2014, 8:14 AM

## 2014-11-20 ENCOUNTER — Ambulatory Visit (HOSPITAL_COMMUNITY): Payer: Medicare Other | Admitting: Physical Therapy

## 2014-11-26 IMAGING — CR DG KNEE COMPLETE 4+V*R*
4 series · 4 of 4 positions shown · non-contrast
Comparison: None.

CLINICAL DATA: Pain and weakness

EXAM:
RIGHT KNEE - COMPLETE 4+ VIEW

[view not recorded (1 of 4)]
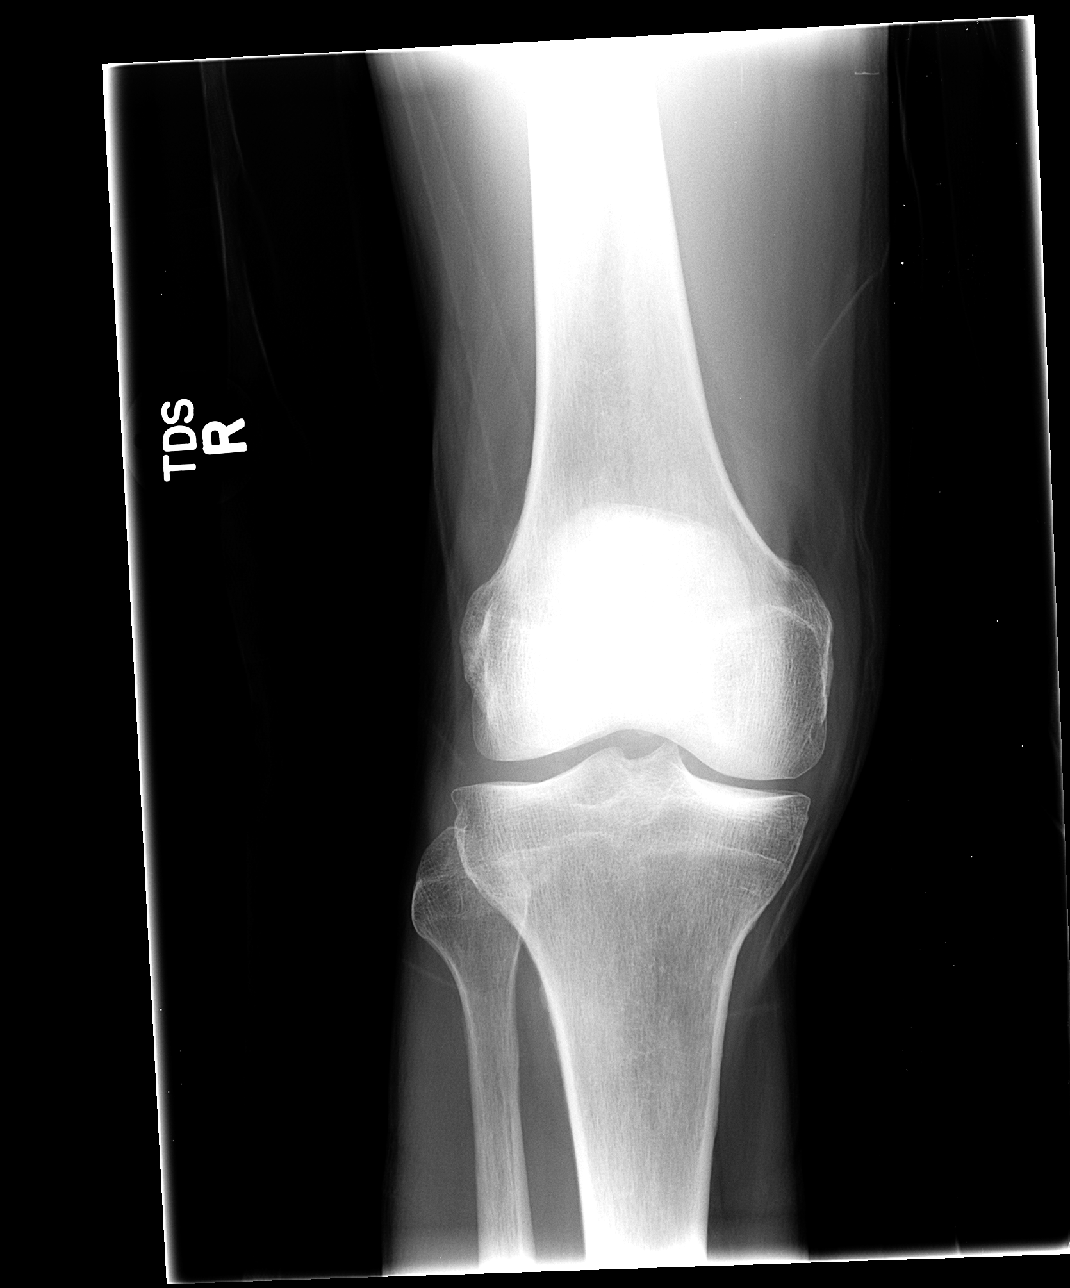

[view not recorded (2 of 4)]
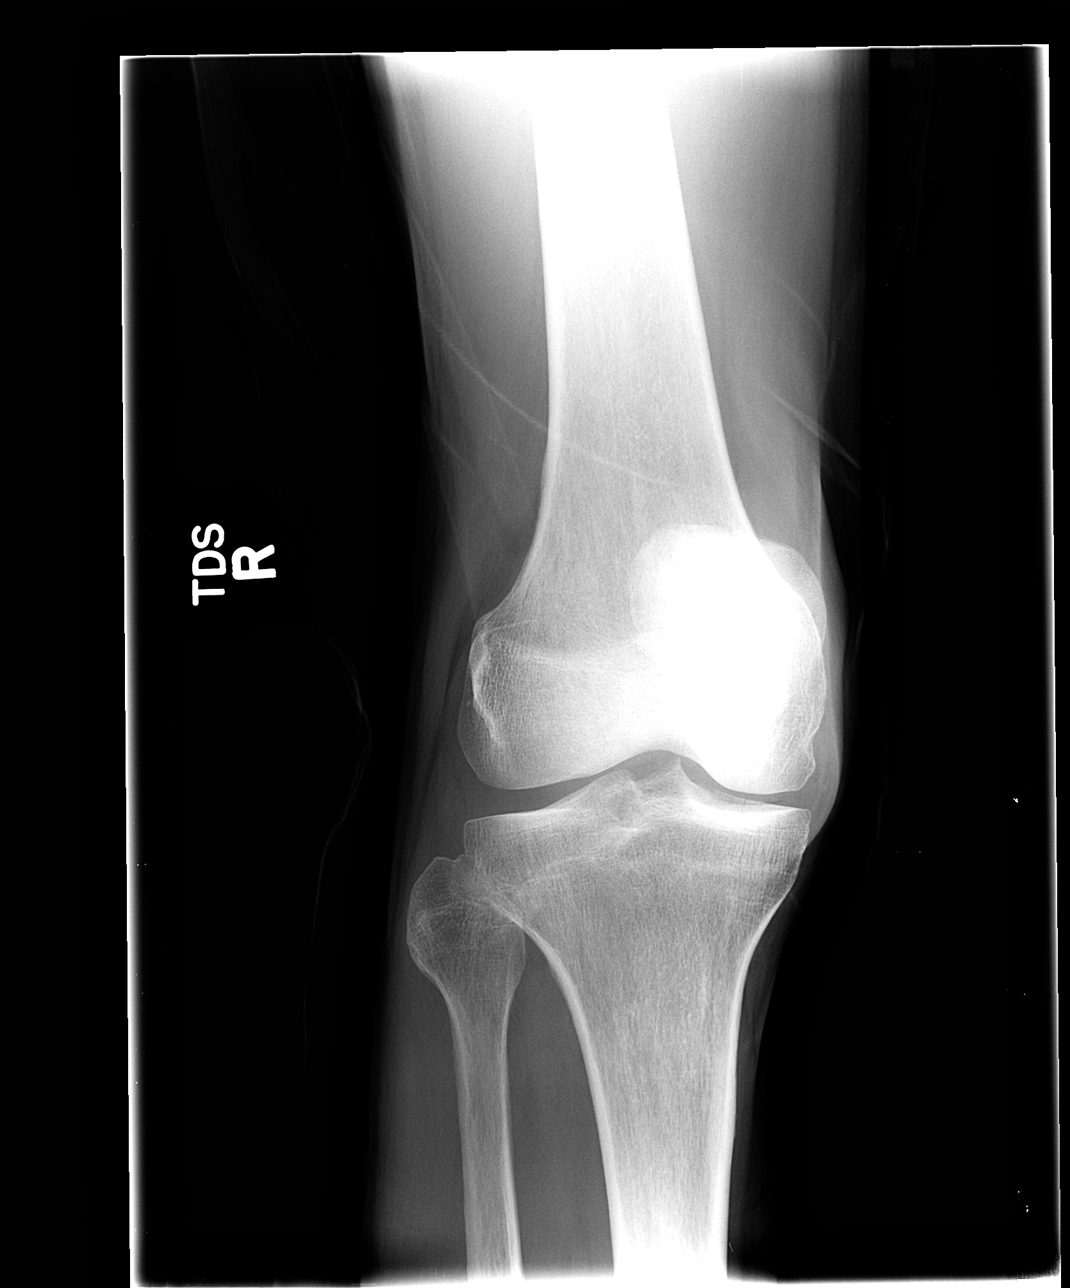

[view not recorded (3 of 4)]
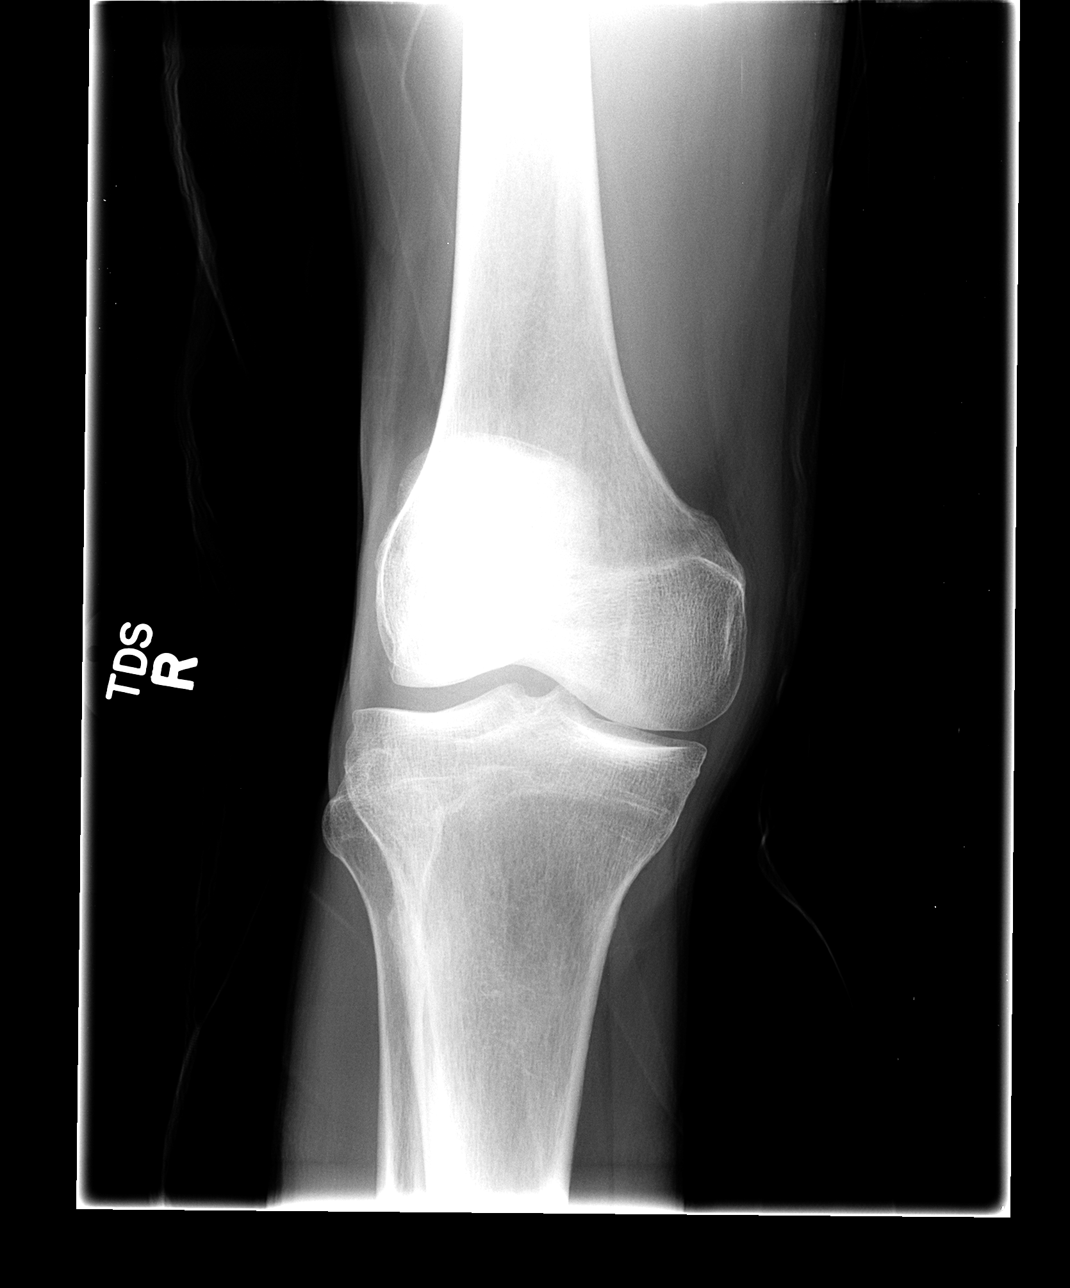

[view not recorded (4 of 4)]
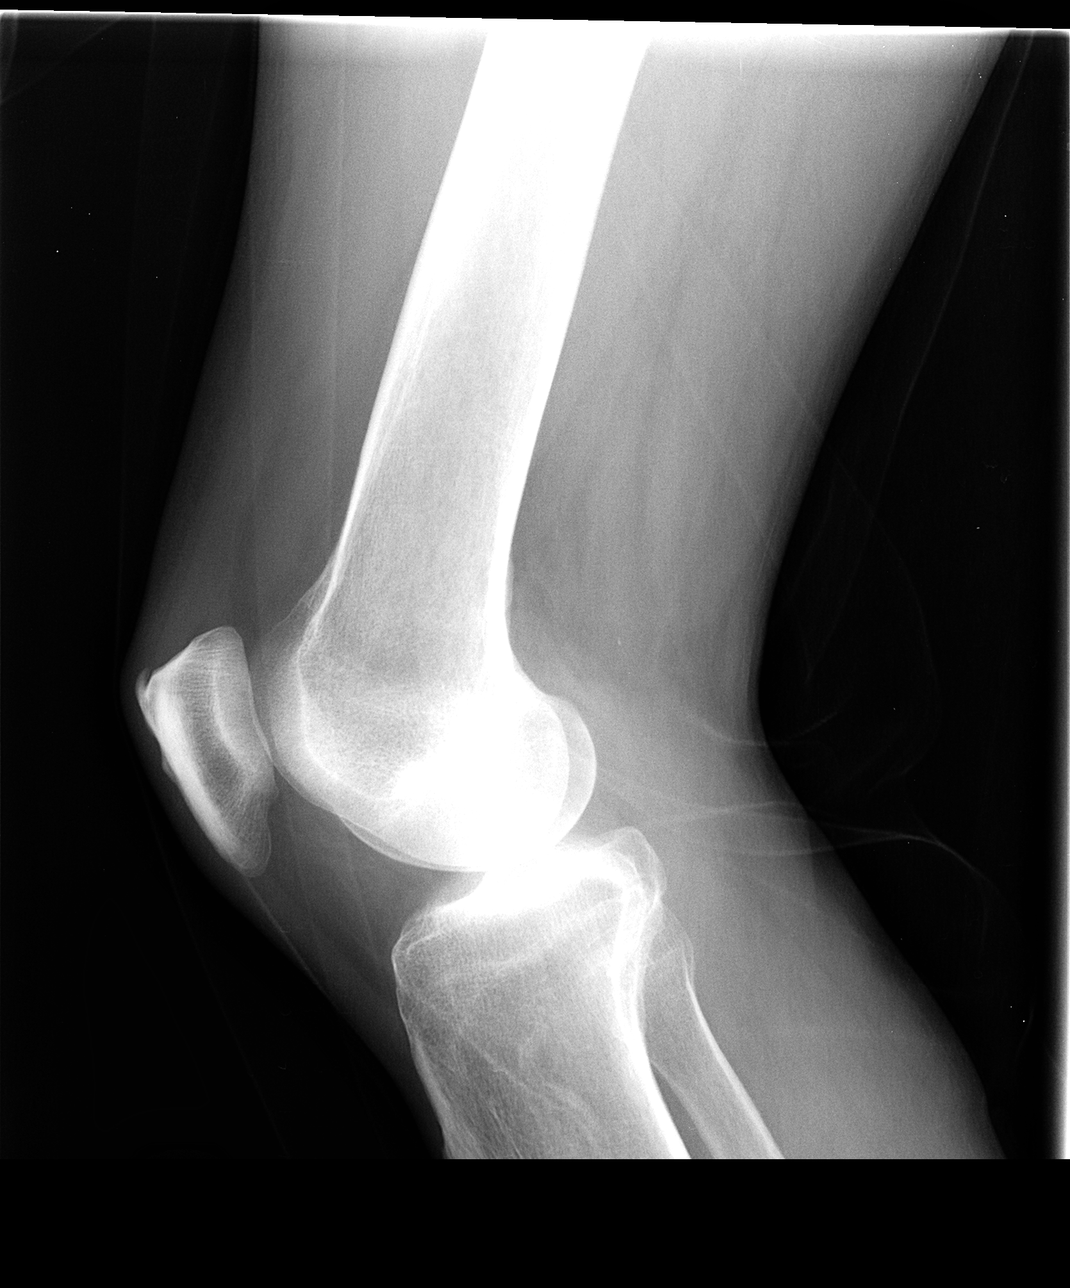

[4 of 4 positions shown; findings below may reference images not displayed]

FINDINGS: There is no evidence of fracture, dislocation, or joint effusion.
Small patellar spur at the insertion of the quadriceps tendon. There
is no evidence of arthropathy or other focal bone abnormality. Soft
tissues are unremarkable.
IMPRESSION: Negative.

## 2015-07-21 DEATH — deceased
